# Patient Record
Sex: Male | Born: 1945 | Race: White | Hispanic: No | Marital: Married | Smoking: Never smoker
Health system: Southern US, Community
[De-identification: ages and names within clinical notes are randomized; demographics above are authoritative.]

## PROBLEM LIST (undated history)

## (undated) DIAGNOSIS — G4730 Sleep apnea, unspecified: Secondary | ICD-10-CM

## (undated) DIAGNOSIS — Z87442 Personal history of urinary calculi: Secondary | ICD-10-CM

## (undated) DIAGNOSIS — K219 Gastro-esophageal reflux disease without esophagitis: Secondary | ICD-10-CM

## (undated) DIAGNOSIS — J189 Pneumonia, unspecified organism: Secondary | ICD-10-CM

## (undated) HISTORY — PX: COLONOSCOPY: SHX174

## (undated) HISTORY — PX: EYE SURGERY: SHX253

## (undated) HISTORY — PX: BICEPS TENDON REPAIR: SHX566

## (undated) HISTORY — PX: SHOULDER SURGERY: SHX246

## (undated) HISTORY — PX: TONSILLECTOMY: SUR1361

---

## 2012-04-25 ENCOUNTER — Emergency Department (HOSPITAL_COMMUNITY): Payer: Medicare Other

## 2012-04-25 ENCOUNTER — Encounter (HOSPITAL_COMMUNITY): Payer: Self-pay | Admitting: Emergency Medicine

## 2012-04-25 ENCOUNTER — Other Ambulatory Visit: Payer: Self-pay

## 2012-04-25 ENCOUNTER — Emergency Department (HOSPITAL_COMMUNITY)
Admission: EM | Admit: 2012-04-25 | Discharge: 2012-04-25 | Disposition: A | Discharge status: Home / Self Care | Payer: Medicare Other | Attending: Emergency Medicine | Admitting: Emergency Medicine

## 2012-04-25 DIAGNOSIS — Z7982 Long term (current) use of aspirin: Secondary | ICD-10-CM | POA: Insufficient documentation

## 2012-04-25 DIAGNOSIS — R42 Dizziness and giddiness: Secondary | ICD-10-CM | POA: Insufficient documentation

## 2012-04-25 DIAGNOSIS — Z79899 Other long term (current) drug therapy: Secondary | ICD-10-CM | POA: Insufficient documentation

## 2012-04-25 DIAGNOSIS — R55 Syncope and collapse: Secondary | ICD-10-CM

## 2012-04-25 LAB — CBC WITH DIFFERENTIAL/PLATELET
Basophils Absolute: 0 10*3/uL (ref 0.0–0.1)
Basophils Relative: 1 % (ref 0–1)
Eosinophils Absolute: 0 10*3/uL (ref 0.0–0.7)
Eosinophils Relative: 0 % (ref 0–5)
Lymphs Abs: 1.2 10*3/uL (ref 0.7–4.0)
MCH: 31 pg (ref 26.0–34.0)
MCV: 85 fL (ref 78.0–100.0)
Neutrophils Relative %: 74 % (ref 43–77)
Platelets: 138 10*3/uL — ABNORMAL LOW (ref 150–400)
RBC: 5.39 MIL/uL (ref 4.22–5.81)
RDW: 12.5 % (ref 11.5–15.5)

## 2012-04-25 LAB — POCT I-STAT, CHEM 8
Calcium, Ion: 1.22 mmol/L (ref 1.13–1.30)
Creatinine, Ser: 1.1 mg/dL (ref 0.50–1.35)
Glucose, Bld: 131 mg/dL — ABNORMAL HIGH (ref 70–99)
HCT: 48 % (ref 39.0–52.0)
Hemoglobin: 16.3 g/dL (ref 13.0–17.0)
Potassium: 4.1 mEq/L (ref 3.5–5.1)
TCO2: 24 mmol/L (ref 0–100)

## 2012-04-25 LAB — COMPREHENSIVE METABOLIC PANEL
AST: 21 U/L (ref 0–37)
Alkaline Phosphatase: 54 U/L (ref 39–117)
BUN: 15 mg/dL (ref 6–23)
CO2: 25 mEq/L (ref 19–32)
Chloride: 104 mEq/L (ref 96–112)
Creatinine, Ser: 0.97 mg/dL (ref 0.50–1.35)
GFR calc non Af Amer: 85 mL/min — ABNORMAL LOW (ref >90)
Potassium: 3.9 mEq/L (ref 3.5–5.1)
Total Bilirubin: 0.7 mg/dL (ref 0.3–1.2)

## 2012-04-25 LAB — PROTIME-INR
INR: 1.04 (ref 0.00–1.49)
Prothrombin Time: 13.8 seconds (ref 11.6–15.2)

## 2012-04-25 NOTE — ED Notes (Addendum)
Pt here with dizziness starting at 0800 while driving to work; pt sts some nausea and trouble walking due to balance issues; no other neuro deficits noted; pt denies vision change or HA; pt sts took viagra this am per norm PRN; EDP SR to see pt in triage

## 2012-04-25 NOTE — ED Notes (Signed)
No swallow screen done due to normal neuro screen. CT normal. No deficits noted. EDP reporting no suspicion for CVA

## 2012-04-25 NOTE — ED Notes (Signed)
Patient transported to CT 

## 2012-04-25 NOTE — ED Provider Notes (Signed)
History     CSN: [id number]  Arrival date & time 04/25/12  1011   First MD Initiated Contact with Patient 04/25/12 1138      Chief Complaint  Patient presents with  . Dizziness  Nathan Douglas presents s/p an acute episode of dizziness.  He reports that towards the end of his ride to work he became acutely off balance.  He reports feeling tilted.  He states that he felt warm but was described by his son as being pale and sweaty.  He reports feeling nauseated also. Denies feeling spinning sensation, vomiting, unilateral weakness, chest pain, palpitations.  States he has never had a similar event.  Denies any recent illnesses but states that he has started a different diet program within the last week that limits the hours during the day in which he consumes food.  He also has started taking a vitamin supplement that contains turmeric.   He went to his PMD's office and after his blood sugar was checked he was referred to the emergency department.  (Consider location/radiation/quality/duration/timing/severity/associated sxs/prior treatment) The history is provided by the patient. No language interpreter was used.    Past Medical History  Diagnosis Date  . Kidney stone     No past surgical history on file.  No family history on file.  History  Substance Use Topics  . Smoking status: Not on file  . Smokeless tobacco: Not on file  . Alcohol Use:       Review of Systems  HENT: Negative.   Eyes: Negative.   Respiratory: Negative.   Cardiovascular: Negative.   Gastrointestinal: Positive for nausea. Negative for vomiting, abdominal pain, diarrhea, constipation, blood in stool and abdominal distention.  Genitourinary: Negative.   Musculoskeletal: Negative.   Skin: Positive for pallor. Negative for color change, rash and wound.  Neurological: Positive for dizziness, weakness and light-headedness. Negative for tremors, seizures, syncope, facial asymmetry, speech difficulty, numbness and  headaches.  Hematological: Negative for adenopathy.  Psychiatric/Behavioral: Negative for behavioral problems and agitation.    Allergies  Review of patient's allergies indicates no known allergies.  Home Medications   Current Outpatient Rx  Name Route Sig Dispense Refill  . VITAMIN C 1000 MG PO TABS Oral Take 2,000 mg by mouth daily.    . ASPIRIN EC 81 MG PO TBEC Oral Take 81 mg by mouth daily.    . CO Q-10 PO Oral Take 1 tablet by mouth daily.    Marland Kitchen GLUCOSAMINE CHONDR COMPLEX PO Oral Take 1 tablet by mouth daily.    Carma Leaven M PLUS PO TABS Oral Take 1 tablet by mouth daily.    Marland Kitchen FISH OIL 1000 MG PO CAPS Oral Take 1 capsule by mouth daily.    Marland Kitchen OVER THE COUNTER MEDICATION Oral Take 1 capsule by mouth 2 (two) times daily. Green Lipid Muscle Oil    . OVER THE COUNTER MEDICATION Oral Take 1 tablet by mouth 2 (two) times daily. [place] Grape Seed    . POTASSIUM CHLORIDE ER 10 MEQ PO TBCR Oral Take 20 mEq by mouth 2 (two) times daily.    . RED YEAST RICE PO Oral Take 1 tablet by mouth daily.    Marland Kitchen SILDENAFIL CITRATE 100 MG PO TABS Oral Take 50 mg by mouth daily as needed. For erectile dysfunction    . TESTOSTERONE CYPIONATE 200 MG/ML IM OIL Intramuscular Inject 100 mg into the muscle every 14 (fourteen) days. 1/2 ml    . ZOLPIDEM TARTRATE 10 MG PO  TABS Oral Take 10 mg by mouth at bedtime as needed. For sleep      BP 125/71  Pulse 57  Temp 97.4 F (36.3 C) (Oral)  Resp 16  SpO2 93%  Physical Exam  Constitutional: He is oriented to person, place, and time. He appears well-developed and well-nourished. No distress.  HENT:  Head: Normocephalic and atraumatic.  Right Ear: External ear normal.  Left Ear: External ear normal.  Nose: Nose normal.  Mouth/Throat: Oropharynx is clear and moist. No oropharyngeal exudate.  Eyes: Conjunctivae and EOM are normal. Pupils are equal, round, and reactive to light. Right eye exhibits no discharge. Left eye exhibits no discharge. No scleral icterus.    Neck: Trachea normal, normal range of motion and full passive range of motion without pain. Neck supple. No JVD present. Carotid bruit is not present. No rigidity. No tracheal deviation, no edema, no erythema and normal range of motion present. No mass and no thyromegaly present.  Cardiovascular: Normal rate, regular rhythm, normal heart sounds and intact distal pulses.  Exam reveals no gallop and no friction rub.   No murmur heard. Pulmonary/Chest: Effort normal and breath sounds normal. No stridor. No respiratory distress. He has no wheezes. He has no rales. He exhibits no tenderness.  Abdominal: Soft. Bowel sounds are normal. He exhibits no distension and no mass. There is no tenderness. There is no rebound and no guarding.  Musculoskeletal: Normal range of motion. He exhibits no edema and no tenderness.  Lymphadenopathy:    He has no cervical adenopathy.  Neurological: He is alert and oriented to person, place, and time. He has normal strength and normal reflexes. He displays no atrophy and no tremor. No cranial nerve deficit or sensory deficit. He exhibits normal muscle tone. He displays no seizure activity. Coordination and gait normal. GCS eye subscore is 4. GCS verbal subscore is 5. GCS motor subscore is 6.  Skin: Skin is warm and dry. No rash noted. He is not diaphoretic. No erythema. No pallor.  Psychiatric: He has a normal mood and affect. His behavior is normal. Judgment normal.    ED Course  Procedures (including critical care time)  Labs Reviewed  COMPREHENSIVE METABOLIC PANEL - Abnormal; Notable for the following:    Glucose, Bld 132 (*)     GFR calc non Af Amer 85 (*)     All other components within normal limits  CBC WITH DIFFERENTIAL - Abnormal; Notable for the following:    MCHC 36.5 (*)     Platelets 138 (*)     All other components within normal limits  GLUCOSE, CAPILLARY - Abnormal; Notable for the following:    Glucose-Capillary 128 (*)     All other components  within normal limits  POCT I-STAT, CHEM 8 - Abnormal; Notable for the following:    Glucose, Bld 131 (*)     All other components within normal limits  PROTIME-INR   Ct Head Wo Contrast  04/25/2012  *RADIOLOGY REPORT*  Clinical Data: Dizziness.  CT HEAD WITHOUT CONTRAST  Technique:  Contiguous axial images were obtained from the base of the skull through the vertex without contrast.  Comparison: Temporal bones CT scan 07/21/2010.  Findings: No acute intracranial abnormalities.  Specifically, no evidence of acute/subacute cerebral ischemia, no acute intracranial hemorrhage, no focal mass, mass effect, hydrocephalus or abnormal intra or extra-axial fluid collections.  No acute displaced skull fractures are identified.  Visualized paranasal sinuses and mastoids are well pneumatized.  IMPRESSION: 1.  No  acute intracranial abnormalities. 2.  The appearance of the brain is normal.  Original Report Authenticated By: Florencia Reasons, M.D.     No diagnosis found.  Date: 04/25/2012  Rate: 58 bpm  Rhythm: sinus bradycardia  QRS Axis: left  Intervals: normal  ST/T Wave abnormalities: normal  Conduction Disutrbances:none  Narrative Interpretation:   Old EKG Reviewed: none available     MDM  Pt Stable, NAD.  Presents s/p acute event where he became pale, sweaty, and nauseated.  Denies any pain during this event.  Pt is currently comfortable, has a steady/confident gait, has also tolerated PO intake.  He has no clinical evidence of a CVA/TIA.  Pt appears nontoxic.  While in the ER there has been no evidence of heart dysrhythmia.  He has no evidence of ischemia on his EKG and a troponin obtained greater than 3hrs s/p the onset of his s/s is negative.  Plan d/c home with instructions to f/u closely with his PMD as well as instructions to establish care with a local cardiologist for further evaluation and risk assessment.  Pt advised that if he is to have further episodes similar to this morning, he  should return immediately to the emergency department for further evaluation and likely admission.        Tobin Chad, MD 04/25/12 1430

## 2014-01-18 ENCOUNTER — Encounter (HOSPITAL_COMMUNITY): Payer: Self-pay | Admitting: Emergency Medicine

## 2014-01-18 ENCOUNTER — Emergency Department (HOSPITAL_COMMUNITY)
Admission: EM | Admit: 2014-01-18 | Discharge: 2014-01-18 | Disposition: A | Discharge status: Home / Self Care | Payer: Medicare Other | Attending: Emergency Medicine | Admitting: Emergency Medicine

## 2014-01-18 DIAGNOSIS — Z79899 Other long term (current) drug therapy: Secondary | ICD-10-CM | POA: Insufficient documentation

## 2014-01-18 DIAGNOSIS — R6883 Chills (without fever): Secondary | ICD-10-CM | POA: Insufficient documentation

## 2014-01-18 DIAGNOSIS — Z87442 Personal history of urinary calculi: Secondary | ICD-10-CM | POA: Insufficient documentation

## 2014-01-18 DIAGNOSIS — R5381 Other malaise: Secondary | ICD-10-CM | POA: Insufficient documentation

## 2014-01-18 DIAGNOSIS — R110 Nausea: Secondary | ICD-10-CM

## 2014-01-18 DIAGNOSIS — R5383 Other fatigue: Secondary | ICD-10-CM

## 2014-01-18 DIAGNOSIS — Z7982 Long term (current) use of aspirin: Secondary | ICD-10-CM | POA: Insufficient documentation

## 2014-01-18 DIAGNOSIS — R109 Unspecified abdominal pain: Secondary | ICD-10-CM

## 2014-01-18 DIAGNOSIS — R638 Other symptoms and signs concerning food and fluid intake: Secondary | ICD-10-CM | POA: Insufficient documentation

## 2014-01-18 LAB — CBC WITH DIFFERENTIAL/PLATELET
BASOS PCT: 0 % (ref 0–1)
Basophils Absolute: 0 10*3/uL (ref 0.0–0.1)
Eosinophils Absolute: 0 10*3/uL (ref 0.0–0.7)
Eosinophils Relative: 0 % (ref 0–5)
HCT: 46.4 % (ref 39.0–52.0)
HEMOGLOBIN: 16.2 g/dL (ref 13.0–17.0)
LYMPHS ABS: 0.8 10*3/uL (ref 0.7–4.0)
Lymphocytes Relative: 19 % (ref 12–46)
MCH: 31.4 pg (ref 26.0–34.0)
MCHC: 34.9 g/dL (ref 30.0–36.0)
MCV: 89.9 fL (ref 78.0–100.0)
MONOS PCT: 14 % — AB (ref 3–12)
Monocytes Absolute: 0.6 10*3/uL (ref 0.1–1.0)
NEUTROS ABS: 3 10*3/uL (ref 1.7–7.7)
NEUTROS PCT: 67 % (ref 43–77)
Platelets: 121 10*3/uL — ABNORMAL LOW (ref 150–400)
RBC: 5.16 MIL/uL (ref 4.22–5.81)
RDW: 13.3 % (ref 11.5–15.5)
WBC: 4.5 10*3/uL (ref 4.0–10.5)

## 2014-01-18 LAB — COMPREHENSIVE METABOLIC PANEL
ALBUMIN: 3.7 g/dL (ref 3.5–5.2)
ALK PHOS: 48 U/L (ref 39–117)
ALT: 34 U/L (ref 0–53)
AST: 31 U/L (ref 0–37)
BILIRUBIN TOTAL: 0.7 mg/dL (ref 0.3–1.2)
BUN: 14 mg/dL (ref 6–23)
CHLORIDE: 103 meq/L (ref 96–112)
CO2: 23 mEq/L (ref 19–32)
Calcium: 8.6 mg/dL (ref 8.4–10.5)
Creatinine, Ser: 1.09 mg/dL (ref 0.50–1.35)
GFR calc Af Amer: 79 mL/min — ABNORMAL LOW (ref >90)
GFR calc non Af Amer: 68 mL/min — ABNORMAL LOW (ref >90)
Glucose, Bld: 101 mg/dL — ABNORMAL HIGH (ref 70–99)
POTASSIUM: 4.1 meq/L (ref 3.7–5.3)
Sodium: 139 mEq/L (ref 137–147)
Total Protein: 6.5 g/dL (ref 6.0–8.3)

## 2014-01-18 LAB — URINALYSIS, ROUTINE W REFLEX MICROSCOPIC
BILIRUBIN URINE: NEGATIVE
GLUCOSE, UA: NEGATIVE mg/dL
KETONES UR: NEGATIVE mg/dL
LEUKOCYTES UA: NEGATIVE
Nitrite: NEGATIVE
PROTEIN: NEGATIVE mg/dL
Specific Gravity, Urine: 1.03 (ref 1.005–1.030)
Urobilinogen, UA: 0.2 mg/dL (ref 0.0–1.0)
pH: 6 (ref 5.0–8.0)

## 2014-01-18 LAB — URINE MICROSCOPIC-ADD ON

## 2014-01-18 MED ORDER — ONDANSETRON 8 MG PO TBDP: 8.0000 mg | ORAL_TABLET | Freq: Three times a day (TID) | ORAL | Status: DC | PRN

## 2014-01-18 MED ORDER — ONDANSETRON HCL 4 MG/2ML IJ SOLN
4.0000 mg | Freq: Once | INTRAMUSCULAR | Status: AC
  Administered 2014-01-18: 4 mg via INTRAVENOUS
  Filled 2014-01-18: qty 2

## 2014-01-18 MED ORDER — SODIUM CHLORIDE 0.9 % IV BOLUS (SEPSIS)
500.0000 mL | Freq: Once | INTRAVENOUS | Status: AC
  Administered 2014-01-18: 500 mL via INTRAVENOUS

## 2014-01-18 NOTE — Discharge Instructions (Signed)
Flank Pain Flank pain refers to pain that is located on the side of the body between the upper abdomen and the back. The pain may occur over a short period of time (acute) or may be long-term or reoccurring (chronic). It may be mild or severe. Flank pain can be caused by many things. CAUSES  Some of the more common causes of flank pain include:  Muscle strains.   Muscle spasms.   A disease of your spine (vertebral disk disease).   A lung infection (pneumonia).   Fluid around your lungs (pulmonary edema).   A kidney infection.   Kidney stones.   A very painful skin rash caused by the chickenpox virus (shingles).   Gallbladder disease.  HOME CARE INSTRUCTIONS  Home care will depend on the cause of your pain. In general,  Rest as directed by your caregiver.  Drink enough fluids to keep your urine clear or pale yellow.  Only take over-the-counter or prescription medicines as directed by your caregiver. Some medicines may help relieve the pain.  Tell your caregiver about any changes in your pain.  Follow up with your caregiver as directed. SEEK IMMEDIATE MEDICAL CARE IF:   Your pain is not controlled with medicine.   You have new or worsening symptoms.  Your pain increases.   You have abdominal pain.   You have shortness of breath.   You have persistent nausea or vomiting.   You have swelling in your abdomen.   You feel faint or pass out.   You have blood in your urine.  You have a fever or persistent symptoms for more than 2 3 days.  You have a fever and your symptoms suddenly get worse. MAKE SURE YOU:   Understand these instructions.  Will watch your condition.  Will get help right away if you are not doing well or get worse. Document Released: 11/09/2005 Document Revised: 06/12/2012 Document Reviewed: 05/02/2012 [facility]uclidExitCare Patient Information 2014 [place]ExitCare, [place].  Nausea, Adult Nausea is the feeling that you have an upset stomach or  have to vomit. Nausea by itself is not likely a serious concern, but it may be an early sign of more serious medical problems. As nausea gets worse, it can lead to vomiting. If vomiting develops, there is the risk of dehydration.  CAUSES   Viral infections.  Food poisoning.  Medicines.  Pregnancy.  Motion sickness.  Migraine headaches.  Emotional distress.  Severe pain from any source.  Alcohol intoxication. HOME CARE INSTRUCTIONS  Get plenty of rest.  Ask your caregiver about specific rehydration instructions.  Eat small amounts of food and sip liquids more often.  Take all medicines as told by your caregiver. SEEK MEDICAL CARE IF:  You have not improved after 2 days, or you get worse.  You have a headache. SEEK IMMEDIATE MEDICAL CARE IF:   You have a fever.  You faint.  You keep vomiting or have blood in your vomit.  You are extremely weak or dehydrated.  You have dark or bloody stools.  You have severe chest or abdominal pain. MAKE SURE YOU:  Understand these instructions.  Will watch your condition.  Will get help right away if you are not doing well or get worse. Document Released: 10/26/2004 Document Revised: 06/12/2012 Document Reviewed: 05/31/2011 [facility] Patient Information 2014 [place], [place].

## 2014-01-18 NOTE — ED Notes (Signed)
Pt presents to department for evaluation of bilateral flank pain. Ongoing since Friday. Also states nausea and generalized weakness. History of kidney stones. 5/10 pain at the time. Pt is alert and oriented x4.

## 2014-01-18 NOTE — ED Notes (Signed)
Sent urine culture request sheet down to lab to add on to urine already in lab.

## 2014-01-18 NOTE — ED Provider Notes (Signed)
CSN: [id number]     Arrival date & time 01/18/14  [id number] History   First MD Initiated Contact with Patient 01/18/14 1106     Chief Complaint  Patient presents with  . Flank Pain     (Consider location/radiation/quality/duration/timing/severity/associated sxs/prior Treatment) Patient is a 68 y.o. male presenting with flank pain. The history is provided by the patient.  Flank Pain Pertinent negatives include no chest pain, no abdominal pain, no headaches and no shortness of breath.   patient so bad the last couple days. He's had myalgias. He's had left flank pain. He states it feels like when he has had a kidney infection previously. He has had nausea without vomiting. No chest pain. No diarrhea constipation. No abdominal pain. No cough. No headache. No sore throat. The pain is dull.  Past Medical History  Diagnosis Date  . Kidney stone    History reviewed. No pertinent past surgical history. No family history on file. History  Substance Use Topics  . Smoking status: Never Smoker   . Smokeless tobacco: Not on file  . Alcohol Use: Yes     Comment: social    Review of Systems  Constitutional: Positive for chills, appetite change and fatigue. Negative for activity change.  Eyes: Negative for pain.  Respiratory: Negative for chest tightness and shortness of breath.   Cardiovascular: Negative for chest pain and leg swelling.  Gastrointestinal: Positive for nausea. Negative for vomiting, abdominal pain and diarrhea.  Genitourinary: Positive for flank pain. Negative for discharge and testicular pain.  Musculoskeletal: Negative for back pain and neck stiffness.  Skin: Negative for rash.  Neurological: Negative for weakness, numbness and headaches.  Psychiatric/Behavioral: Negative for behavioral problems.      Allergies  Review of patient's allergies indicates no known allergies.  Home Medications   Prior to Admission medications   Medication Sig Start Date End Date Taking?  Authorizing Provider  Ascorbic Acid (VITAMIN C) 1000 MG tablet Take 2,000 mg by mouth daily.    Historical Provider, MD  aspirin EC 81 MG tablet Take 81 mg by mouth daily.    Historical Provider, MD  Coenzyme Q10 (CO Q-10 PO) Take 1 tablet by mouth daily.    Historical Provider, MD  Glucosamine-Chondroitin (GLUCOSAMINE CHONDR COMPLEX PO) Take 1 tablet by mouth daily.    Historical Provider, MD  Multiple Vitamins-Minerals (MULTIVITAMINS THER. W/MINERALS) TABS Take 1 tablet by mouth daily.    Historical Provider, MD  Omega-3 Fatty Acids (FISH OIL) 1000 MG CAPS Take 1 capsule by mouth daily.    Historical Provider, MD  OVER THE COUNTER MEDICATION Take 1 capsule by mouth 2 (two) times daily. Green Lipid Muscle Oil    Historical Provider, MD  OVER THE COUNTER MEDICATION Take 1 tablet by mouth 2 (two) times daily. [place] Grape Seed    Historical Provider, MD  potassium chloride (K-DUR) 10 MEQ tablet Take 20 mEq by mouth 2 (two) times daily.    Historical Provider, MD  Red Yeast Rice Extract (RED YEAST RICE PO) Take 1 tablet by mouth daily.    Historical Provider, MD  sildenafil (VIAGRA) 100 MG tablet Take 50 mg by mouth daily as needed. For erectile dysfunction    Historical Provider, MD  testosterone cypionate (DEPOTESTOTERONE CYPIONATE) 200 MG/ML injection Inject 100 mg into the muscle every 14 (fourteen) days. 1/2 ml    Historical Provider, MD  zolpidem (AMBIEN) 10 MG tablet Take 10 mg by mouth at bedtime as needed. For sleep    Historical  Provider, MD   BP 120/71  Pulse 70  Temp(Src) 98.4 F (36.9 C) (Oral)  Resp 18  SpO2 95% Physical Exam  Nursing note and vitals reviewed. Constitutional: He is oriented to person, place, and time. He appears well-developed and well-nourished.  HENT:  Head: Normocephalic and atraumatic.  Mouth/Throat: No oropharyngeal exudate.  Neck: Normal range of motion. Neck supple.  Cardiovascular: Normal rate, regular rhythm and normal heart sounds.   No murmur  heard. Pulmonary/Chest: Effort normal and breath sounds normal.  Abdominal: Soft. Bowel sounds are normal. He exhibits no distension and no mass. There is no tenderness. There is no rebound and no guarding.  Genitourinary:  Mild tenderness and left CVA area.  Musculoskeletal: Normal range of motion. He exhibits no edema.  Neurological: He is alert and oriented to person, place, and time. No cranial nerve deficit.  Skin: Skin is warm and dry.  Psychiatric: He has a normal mood and affect.    ED Course  Procedures (including critical care time) Labs Review Labs Reviewed  CBC WITH DIFFERENTIAL - Abnormal; Notable for the following:    Platelets 121 (*)    Monocytes Relative 14 (*)    All other components within normal limits  COMPREHENSIVE METABOLIC PANEL - Abnormal; Notable for the following:    Glucose, Bld 101 (*)    GFR calc non Af Amer 68 (*)    GFR calc Af Amer 79 (*)    All other components within normal limits  URINALYSIS, ROUTINE W REFLEX MICROSCOPIC - Abnormal; Notable for the following:    Color, Urine AMBER (*)    Hgb urine dipstick TRACE (*)    All other components within normal limits  URINE CULTURE  URINE MICROSCOPIC-ADD ON    Imaging Review No results found.   EKG Interpretation None      MDM   Final diagnoses:  Nausea  Flank pain    Patient with nausea and left flank pain. No abdominal tenderness. Weber is reassuring. Patient felt as if he had a UTI, however urinalysis overall reassuring. Urine cultures were sent. Patient is tolerated orals will be discharged home. He feels better after the IV Zofran. May declare itself to be more viral syndrome/gastroenteritis, but may require followup.    Juliet RudeNathan R. Rubin PayorPickering, MD 01/18/14 1413

## 2014-01-18 NOTE — ED Notes (Signed)
Onset Friday pt started not feeling well, generalized aches, nausea and bilateral flank pain.  Pt was able to do normal activities on Friday and Saturday morning.  No other s/s noted.

## 2014-01-19 LAB — URINE CULTURE
CULTURE: NO GROWTH
Colony Count: NO GROWTH

## 2016-10-28 ENCOUNTER — Encounter (HOSPITAL_COMMUNITY): Payer: Self-pay | Admitting: *Deleted

## 2016-10-28 ENCOUNTER — Emergency Department (HOSPITAL_COMMUNITY)
Admission: EM | Admit: 2016-10-28 | Discharge: 2016-10-29 | Disposition: A | Discharge status: Home / Self Care | Payer: Medicare HMO | Attending: Emergency Medicine | Admitting: Emergency Medicine

## 2016-10-28 DIAGNOSIS — W109XXA Fall (on) (from) unspecified stairs and steps, initial encounter: Secondary | ICD-10-CM | POA: Insufficient documentation

## 2016-10-28 DIAGNOSIS — Y999 Unspecified external cause status: Secondary | ICD-10-CM | POA: Insufficient documentation

## 2016-10-28 DIAGNOSIS — Y9389 Activity, other specified: Secondary | ICD-10-CM | POA: Insufficient documentation

## 2016-10-28 DIAGNOSIS — Y929 Unspecified place or not applicable: Secondary | ICD-10-CM | POA: Diagnosis not present

## 2016-10-28 DIAGNOSIS — S76112A Strain of left quadriceps muscle, fascia and tendon, initial encounter: Secondary | ICD-10-CM | POA: Insufficient documentation

## 2016-10-28 DIAGNOSIS — S8992XA Unspecified injury of left lower leg, initial encounter: Secondary | ICD-10-CM | POA: Diagnosis present

## 2016-10-28 DIAGNOSIS — Z7982 Long term (current) use of aspirin: Secondary | ICD-10-CM | POA: Diagnosis not present

## 2016-10-28 NOTE — ED Provider Notes (Signed)
[facility]-EMERGENCY DEPT Provider Note   CSN: [id number] Arrival date & time: 10/28/16  2315  By signing my name below, I, Doreatha Martin, attest that this documentation has been prepared under the direction and in the presence of Zadie Rhine, MD. Electronically Signed: Doreatha Martin, ED Scribe. 10/28/16. 11:55 PM.     History   Chief Complaint Chief Complaint  Patient presents with  . Leg Injury    HPI Nathan Douglas is a 71 y.o. male who presents to the Emergency Department complaining of moderate left knee pain s/p mechanical fall that occurred earlier tonight. Pt states he slipped while going down the stairs in flip flops, fell forward and landed directly on his left knee. He denies LOC, head injury. He states he is currently unable to bear weight on the knee secondary to pain. Pt denies taking OTC medications at home to improve symptoms. He also denies headache, neck pain, back pain, arm pain, CP, SOB, additional injuries or complaints.   The history is provided by the patient. No language interpreter was used.  Fall  This is a new problem. The current episode started 1 to 2 hours ago. The problem occurs constantly. The problem has been gradually worsening. Pertinent negatives include no chest pain, no abdominal pain, no headaches and no shortness of breath. The symptoms are aggravated by walking. Nothing relieves the symptoms. He has tried nothing for the symptoms. The treatment provided no relief.    Past Medical History:  Diagnosis Date  . Kidney stone     There are no active problems to display for this patient.   History reviewed. No pertinent surgical history.     Home Medications    Prior to Admission medications   Medication Sig Start Date End Date Taking? Authorizing Provider  Ascorbic Acid (VITAMIN C) 1000 MG tablet Take 2,000 mg by mouth daily.    Historical Provider, MD  aspirin EC 81 MG tablet Take 81 mg by mouth daily.    Historical Provider, MD  Coenzyme Q10  (CO Q-10 PO) Take 1 tablet by mouth daily.    Historical Provider, MD  Glucosamine-Chondroitin (GLUCOSAMINE CHONDR COMPLEX PO) Take 1 tablet by mouth daily.    Historical Provider, MD  KRILL OIL PO Take 1 tablet by mouth daily.    Historical Provider, MD  Multiple Vitamins-Minerals (MULTIVITAMINS THER. W/MINERALS) TABS Take 1 tablet by mouth daily.    Historical Provider, MD  Omega-3 Fatty Acids (FISH OIL) 1000 MG CAPS Take 1 capsule by mouth daily.    Historical Provider, MD  ondansetron (ZOFRAN-ODT) 8 MG disintegrating tablet Take 1 tablet (8 mg total) by mouth every 8 (eight) hours as needed for nausea or vomiting. 01/18/14   Benjiman Core, MD  potassium chloride (K-DUR) 10 MEQ tablet Take 10 mEq by mouth 2 (two) times daily.     Historical Provider, MD  Red Yeast Rice Extract (RED YEAST RICE PO) Take 1 tablet by mouth daily.    Historical Provider, MD  Saw Palmetto, Serenoa repens, (SAW PALMETTO PO) Take 1 tablet by mouth daily.    Historical Provider, MD  sildenafil (VIAGRA) 100 MG tablet Take 50 mg by mouth daily as needed. For erectile dysfunction    Historical Provider, MD  testosterone cypionate (DEPOTESTOTERONE CYPIONATE) 200 MG/ML injection Inject 100 mg into the muscle every 14 (fourteen) days. 1/2 ml    Historical Provider, MD  zolpidem (AMBIEN) 10 MG tablet Take 10 mg by mouth at bedtime as needed. For sleep  Historical Provider, MD    Family History No family history on file.  Social History Social History  Substance Use Topics  . Smoking status: Never Smoker  . Smokeless tobacco: Never Used  . Alcohol use Yes     Comment: social     Allergies   Patient has no known allergies.   Review of Systems Review of Systems  Respiratory: Negative for shortness of breath.   Cardiovascular: Negative for chest pain.  Gastrointestinal: Negative for abdominal pain.  Musculoskeletal: Positive for arthralgias (left knee). Negative for back pain and neck pain.  Neurological:  Negative for syncope and headaches.  All other systems reviewed and are negative.    Physical Exam Updated Vital Signs BP 142/78 (BP Location: Left Arm)   Pulse 76   Temp 97.9 F (36.6 C) (Oral)   Resp 18   Ht 5\' 8"  (1.727 m)   Wt 172 lb (78 kg)   SpO2 96%   BMI 26.15 kg/m   Physical Exam CONSTITUTIONAL: Well developed/well nourished HEAD: Normocephalic/atraumatic EYES: EOMI ENMT: Mucous membranes moist NECK: supple no meningeal signs SPINE/BACK:entire spine nontender. No c-spine tenderness.  CV: S1/S2 noted, no murmurs/rubs/gallops noted LUNGS: Lungs are clear to auscultation bilaterally, no apparent distress ABDOMEN: soft, nontender, no rebound or guarding, bowel sounds noted throughout abdomen GU:no cva tenderness NEURO: Pt is awake/alert/appropriate, moves all extremitiesx4.  No facial droop.   EXTREMITIES: pulses normal/equal. Mild tenderness to left knee. Step-off proximal to patella likely d/t quadriceps tendon tear. DPs intact, All other extremities/joints palpated/ranged and nontender.  Left achilles intact SKIN: warm, color normal PSYCH: no abnormalities of mood noted, alert and oriented to situation   ED Treatments / Results   DIAGNOSTIC STUDIES: Oxygen Saturation is 96% on RA, adequate by my interpretation.    COORDINATION OF CARE: 11:50 PM Discussed treatment plan with pt at bedside which includes XR, knee immobilizer and crutches, ortho f/u and pt agreed to plan.    Labs (all labs ordered are listed, but only abnormal results are displayed) Labs Reviewed - No data to display  EKG  EKG Interpretation None       Radiology Dg Knee Complete 4 Views Left  Result Date: 10/29/2016 CLINICAL DATA:  Left knee pain with difficulty bearing weight after falling down stairs. Quadriceps tendon rupture. EXAM: LEFT KNEE - COMPLETE 4+ VIEW COMPARISON:  None. FINDINGS: There is 10 x 2 mm avulsed bony fragment seen on the lateral view along the anterior aspect of  the distal left thigh likely representing a quadriceps tendon avulsion. This fragment is 3 cm above the upper pole of the patella. Soft tissue thickening adjacent to the avulsed fragment likely represents a retracted tendon. No significant joint effusion. The femorotibial and patellofemoral compartments maintained. IMPRESSION: Findings would be in keeping with an avulsion of the quadriceps tendon insertion with small 10 x 2mm bony fragment seen approximately 3 cm from the upper pole of the patella. Electronically Signed   By: Tollie Eth M.D.   On: 10/29/2016 01:26    Procedures Procedures (including critical care time) SPLINT APPLICATION Date/Time: [id number] Authorized by: Joya Gaskins Consent: Verbal consent obtained. Risks and benefits: risks, benefits and alternatives were discussed Consent given by: patient Splint applied by: orthopedic technician Location details: left leg Splint type: knee immobilizer Supplies used: knee immobilizer Post-procedure: The splinted body part was neurovascularly unchanged following the procedure. Patient tolerance: Patient tolerated the procedure well with no immediate complications.    Medications Ordered in ED Medications - No  data to display   Initial Impression / Assessment and Plan / ED Course  I have reviewed the triage vital signs and the nursing notes.  Pertinent  imaging results that were available during my care of the patient were reviewed by me and considered in my medical decision making (see chart for details).     Pt with quad tendon tear by exam No other acute extremity injury Pt ambulatory Tolerated knee immobilizer and crutches well He will call ortho in 24 hours He declines pain meds    Final Clinical Impressions(s) / ED Diagnoses   Final diagnoses:  Quadriceps tendon rupture, left, initial encounter    New Prescriptions New Prescriptions   No medications on file    I personally performed the services  described in this documentation, which was scribed in my presence. The recorded information has been reviewed and is accurate.        Zadie Rhineonald Braheem Tomasik, MD 10/29/16 [phone]

## 2016-10-28 NOTE — ED Triage Notes (Signed)
Patient states he feel down some stairs at home while wearing flip flops and reading glassses  Unable to bear weight on his left leg.

## 2016-10-29 ENCOUNTER — Emergency Department (HOSPITAL_COMMUNITY): Payer: Medicare HMO

## 2016-10-29 NOTE — Progress Notes (Signed)
Orthopedic Tech Progress Note Patient Details:  Nathan LewandowskyJoseph Douglas May 06, 1946 [id number]  Ortho Devices Type of Ortho Device: Crutches, Knee Immobilizer Ortho Device/Splint Location: LLE Ortho Device/Splint Interventions: Ordered, Application, Adjustment   Nathan MoccasinHughes, Nathan Douglas 10/29/2016, 1:19 AM

## 2016-11-02 ENCOUNTER — Encounter (HOSPITAL_COMMUNITY): Payer: Self-pay | Admitting: *Deleted

## 2016-11-02 NOTE — Progress Notes (Signed)
Spoke with pt for pre-op call. Pt denies any cardiac history, chest pain or sob. 

## 2016-11-03 ENCOUNTER — Ambulatory Visit (HOSPITAL_COMMUNITY): Payer: Medicare HMO | Admitting: Anesthesiology

## 2016-11-03 ENCOUNTER — Encounter (HOSPITAL_COMMUNITY)
Admission: RE | Disposition: A | Discharge status: Home / Self Care | Payer: Self-pay | Source: Ambulatory Visit | Attending: Orthopedic Surgery

## 2016-11-03 ENCOUNTER — Encounter (HOSPITAL_COMMUNITY): Payer: Self-pay | Admitting: Anesthesiology

## 2016-11-03 ENCOUNTER — Ambulatory Visit (HOSPITAL_COMMUNITY)
Admission: RE | Admit: 2016-11-03 | Discharge: 2016-11-03 | Disposition: A | Discharge status: Home / Self Care | Payer: Medicare HMO | Source: Ambulatory Visit | Attending: Orthopedic Surgery | Admitting: Orthopedic Surgery

## 2016-11-03 DIAGNOSIS — Z79899 Other long term (current) drug therapy: Secondary | ICD-10-CM | POA: Insufficient documentation

## 2016-11-03 DIAGNOSIS — S76112A Strain of left quadriceps muscle, fascia and tendon, initial encounter: Secondary | ICD-10-CM | POA: Diagnosis present

## 2016-11-03 DIAGNOSIS — Z7951 Long term (current) use of inhaled steroids: Secondary | ICD-10-CM | POA: Diagnosis not present

## 2016-11-03 DIAGNOSIS — W19XXXA Unspecified fall, initial encounter: Secondary | ICD-10-CM | POA: Insufficient documentation

## 2016-11-03 HISTORY — DX: Gastro-esophageal reflux disease without esophagitis: K21.9

## 2016-11-03 HISTORY — PX: QUADRICEPS TENDON REPAIR: SHX756

## 2016-11-03 HISTORY — DX: Pneumonia, unspecified organism: J18.9

## 2016-11-03 HISTORY — DX: Personal history of urinary calculi: Z87.442

## 2016-11-03 HISTORY — DX: Sleep apnea, unspecified: G47.30

## 2016-11-03 LAB — BASIC METABOLIC PANEL
Anion gap: 7 (ref 5–15)
BUN: 21 mg/dL — ABNORMAL HIGH (ref 6–20)
CALCIUM: 9.2 mg/dL (ref 8.9–10.3)
CO2: 23 mmol/L (ref 22–32)
CREATININE: 1.05 mg/dL (ref 0.61–1.24)
Chloride: 108 mmol/L (ref 101–111)
GFR calc non Af Amer: >60 mL/min (ref >60)
GLUCOSE: 95 mg/dL (ref 65–99)
Potassium: 5.8 mmol/L — ABNORMAL HIGH (ref 3.5–5.1)
Sodium: 138 mmol/L (ref 135–145)

## 2016-11-03 LAB — CBC
HCT: 48.1 % (ref 39.0–52.0)
Hemoglobin: 16.9 g/dL (ref 13.0–17.0)
MCH: 30.7 pg (ref 26.0–34.0)
MCHC: 35.1 g/dL (ref 30.0–36.0)
MCV: 87.3 fL (ref 78.0–100.0)
PLATELETS: 146 10*3/uL — AB (ref 150–400)
RBC: 5.51 MIL/uL (ref 4.22–5.81)
RDW: 13 % (ref 11.5–15.5)
WBC: 4.6 10*3/uL (ref 4.0–10.5)

## 2016-11-03 SURGERY — REPAIR, TENDON, QUADRICEPS
Anesthesia: Regional | Site: Knee | Laterality: Left

## 2016-11-03 MED ORDER — MIDAZOLAM HCL 2 MG/2ML IJ SOLN
INTRAMUSCULAR | Status: AC
  Administered 2016-11-03: 1 mg
  Filled 2016-11-03: qty 2

## 2016-11-03 MED ORDER — MIDAZOLAM HCL 2 MG/2ML IJ SOLN
INTRAMUSCULAR | Status: AC
  Filled 2016-11-03: qty 2

## 2016-11-03 MED ORDER — EPINEPHRINE PF 1 MG/ML IJ SOLN
INTRAMUSCULAR | Status: AC
  Filled 2016-11-03: qty 1

## 2016-11-03 MED ORDER — BUPIVACAINE HCL (PF) 0.5 % IJ SOLN
INTRAMUSCULAR | Status: AC
  Filled 2016-11-03: qty 30

## 2016-11-03 MED ORDER — MIDAZOLAM HCL 5 MG/5ML IJ SOLN
INTRAMUSCULAR | Status: DC | PRN
  Administered 2016-11-03: 2 mg via INTRAVENOUS

## 2016-11-03 MED ORDER — CHLORHEXIDINE GLUCONATE CLOTH 2 % EX PADS: 6.0000 | MEDICATED_PAD | Freq: Once | CUTANEOUS | Status: DC

## 2016-11-03 MED ORDER — 0.9 % SODIUM CHLORIDE (POUR BTL) OPTIME
TOPICAL | Status: DC | PRN
  Administered 2016-11-03: 1000 mL

## 2016-11-03 MED ORDER — PHENYLEPHRINE 40 MCG/ML (10ML) SYRINGE FOR IV PUSH (FOR BLOOD PRESSURE SUPPORT)
PREFILLED_SYRINGE | INTRAVENOUS | Status: AC
  Filled 2016-11-03: qty 10

## 2016-11-03 MED ORDER — KETOROLAC TROMETHAMINE 30 MG/ML IJ SOLN
INTRAMUSCULAR | Status: DC | PRN
  Administered 2016-11-03: 30 mg via INTRAVENOUS

## 2016-11-03 MED ORDER — CEFAZOLIN SODIUM 1 G IJ SOLR
INTRAMUSCULAR | Status: AC
  Filled 2016-11-03: qty 20

## 2016-11-03 MED ORDER — LIDOCAINE 2% (20 MG/ML) 5 ML SYRINGE
INTRAMUSCULAR | Status: AC
  Filled 2016-11-03: qty 5

## 2016-11-03 MED ORDER — ONDANSETRON HCL 4 MG/2ML IJ SOLN
INTRAMUSCULAR | Status: DC | PRN
  Administered 2016-11-03: 4 mg via INTRAVENOUS

## 2016-11-03 MED ORDER — EPHEDRINE SULFATE 50 MG/ML IJ SOLN
INTRAMUSCULAR | Status: DC | PRN
  Administered 2016-11-03: 10 mg via INTRAVENOUS

## 2016-11-03 MED ORDER — ROPIVACAINE HCL 7.5 MG/ML IJ SOLN
INTRAMUSCULAR | Status: DC | PRN
  Administered 2016-11-03: 30 mL via PERINEURAL

## 2016-11-03 MED ORDER — FENTANYL CITRATE (PF) 100 MCG/2ML IJ SOLN
INTRAMUSCULAR | Status: AC
  Administered 2016-11-03: 50 ug
  Filled 2016-11-03: qty 2

## 2016-11-03 MED ORDER — PHENYLEPHRINE 40 MCG/ML (10ML) SYRINGE FOR IV PUSH (FOR BLOOD PRESSURE SUPPORT)
PREFILLED_SYRINGE | INTRAVENOUS | Status: DC | PRN
  Administered 2016-11-03: 80 ug via INTRAVENOUS
  Administered 2016-11-03: 40 ug via INTRAVENOUS
  Administered 2016-11-03: 80 ug via INTRAVENOUS
  Administered 2016-11-03: 40 ug via INTRAVENOUS
  Administered 2016-11-03 (×2): 80 ug via INTRAVENOUS

## 2016-11-03 MED ORDER — LACTATED RINGERS IV SOLN
INTRAVENOUS | Status: DC
  Administered 2016-11-03: 50 mL/h via INTRAVENOUS
  Administered 2016-11-03: 16:00:00 via INTRAVENOUS

## 2016-11-03 MED ORDER — FENTANYL CITRATE (PF) 100 MCG/2ML IJ SOLN
INTRAMUSCULAR | Status: DC | PRN
  Administered 2016-11-03 (×2): 25 ug via INTRAVENOUS

## 2016-11-03 MED ORDER — PROPOFOL 10 MG/ML IV BOLUS
INTRAVENOUS | Status: DC | PRN
  Administered 2016-11-03: 200 mg via INTRAVENOUS

## 2016-11-03 MED ORDER — LIDOCAINE 2% (20 MG/ML) 5 ML SYRINGE
INTRAMUSCULAR | Status: DC | PRN
  Administered 2016-11-03: 60 mg via INTRAVENOUS

## 2016-11-03 MED ORDER — CEFAZOLIN SODIUM-DEXTROSE 2-4 GM/100ML-% IV SOLN
2.0000 g | INTRAVENOUS | Status: AC
  Administered 2016-11-03: 2 g via INTRAVENOUS

## 2016-11-03 MED ORDER — PROPOFOL 10 MG/ML IV BOLUS
INTRAVENOUS | Status: AC
  Filled 2016-11-03: qty 20

## 2016-11-03 MED ORDER — OXYCODONE-ACETAMINOPHEN 5-325 MG PO TABS: 1.0000 | ORAL_TABLET | ORAL | 0 refills | Status: AC | PRN

## 2016-11-03 MED ORDER — CHLORHEXIDINE GLUCONATE 4 % EX LIQD: 60.0000 mL | Freq: Once | CUTANEOUS | Status: DC

## 2016-11-03 MED ORDER — METHOCARBAMOL 500 MG PO TABS: 500.0000 mg | ORAL_TABLET | Freq: Three times a day (TID) | ORAL | 1 refills | Status: AC | PRN

## 2016-11-03 MED ORDER — FENTANYL CITRATE (PF) 100 MCG/2ML IJ SOLN
INTRAMUSCULAR | Status: AC
  Filled 2016-11-03: qty 2

## 2016-11-03 MED ORDER — EPHEDRINE 5 MG/ML INJ
INTRAVENOUS | Status: AC
  Filled 2016-11-03: qty 20

## 2016-11-03 SURGICAL SUPPLY — 71 items
BANDAGE ACE 4X5 VEL STRL LF (GAUZE/BANDAGES/DRESSINGS) IMPLANT
BANDAGE ACE 6X5 VEL STRL LF (GAUZE/BANDAGES/DRESSINGS) IMPLANT
BANDAGE ESMARK 6X9 LF (GAUZE/BANDAGES/DRESSINGS) ×1 IMPLANT
BLADE SURG 10 STRL SS (BLADE) ×3 IMPLANT
BLADE SURG 15 STRL LF DISP TIS (BLADE) ×1 IMPLANT
BLADE SURG 15 STRL SS (BLADE) ×2
BLADE SURG ROTATE 9660 (MISCELLANEOUS) IMPLANT
BNDG COHESIVE 6X5 TAN STRL LF (GAUZE/BANDAGES/DRESSINGS) ×3 IMPLANT
BNDG ELASTIC 6X10 VLCR STRL LF (GAUZE/BANDAGES/DRESSINGS) ×3 IMPLANT
BNDG ESMARK 6X9 LF (GAUZE/BANDAGES/DRESSINGS) ×3
BNDG GAUZE ELAST 4 BULKY (GAUZE/BANDAGES/DRESSINGS) ×6 IMPLANT
CLEANER TIP ELECTROSURG 2X2 (MISCELLANEOUS) ×3 IMPLANT
COVER MAYO STAND STRL (DRAPES) ×3 IMPLANT
COVER SURGICAL LIGHT HANDLE (MISCELLANEOUS) ×3 IMPLANT
CUFF TOURNIQUET SINGLE 34IN LL (TOURNIQUET CUFF) ×3 IMPLANT
CUFF TOURNIQUET SINGLE 44IN (TOURNIQUET CUFF) IMPLANT
DRAPE INCISE IOBAN 66X45 STRL (DRAPES) IMPLANT
DRAPE U-SHAPE 47X51 STRL (DRAPES) ×3 IMPLANT
DRSG ADAPTIC 3X8 NADH LF (GAUZE/BANDAGES/DRESSINGS) ×3 IMPLANT
DRSG PAD ABDOMINAL 8X10 ST (GAUZE/BANDAGES/DRESSINGS) ×6 IMPLANT
DURAPREP 26ML APPLICATOR (WOUND CARE) ×6 IMPLANT
ELECT NEEDLE TIP 2.8 STRL (NEEDLE) IMPLANT
ELECT REM PT RETURN 9FT ADLT (ELECTROSURGICAL) ×3
ELECTRODE REM PT RTRN 9FT ADLT (ELECTROSURGICAL) ×1 IMPLANT
GAUZE SPONGE 4X4 12PLY STRL (GAUZE/BANDAGES/DRESSINGS) ×3 IMPLANT
GLOVE BIO SURGEON STRL SZ7.5 (GLOVE) ×3 IMPLANT
GLOVE BIOGEL PI ORTHO PRO SZ8 (GLOVE) ×2
GLOVE ORTHO TXT STRL SZ7.5 (GLOVE) ×3 IMPLANT
GLOVE PI ORTHO PRO STRL SZ8 (GLOVE) ×1 IMPLANT
GLOVE SURG ORTHO 8.5 STRL (GLOVE) ×3 IMPLANT
GOWN STRL REUS W/ TWL LRG LVL3 (GOWN DISPOSABLE) ×1 IMPLANT
GOWN STRL REUS W/ TWL XL LVL3 (GOWN DISPOSABLE) ×2 IMPLANT
GOWN STRL REUS W/TWL LRG LVL3 (GOWN DISPOSABLE) ×2
GOWN STRL REUS W/TWL XL LVL3 (GOWN DISPOSABLE) ×4
KIT BASIN OR (CUSTOM PROCEDURE TRAY) ×3 IMPLANT
KIT ROOM TURNOVER OR (KITS) ×3 IMPLANT
MANIFOLD NEPTUNE II (INSTRUMENTS) ×3 IMPLANT
NEEDLE 1/2 CIR MAYO (NEEDLE) ×3 IMPLANT
NEEDLE 22X1 1/2 (OR ONLY) (NEEDLE) IMPLANT
NS IRRIG 1000ML POUR BTL (IV SOLUTION) ×3 IMPLANT
PACK ORTHO EXTREMITY (CUSTOM PROCEDURE TRAY) ×3 IMPLANT
PAD ARMBOARD 7.5X6 YLW CONV (MISCELLANEOUS) ×6 IMPLANT
PAD CAST 4YDX4 CTTN HI CHSV (CAST SUPPLIES) IMPLANT
PADDING CAST COTTON 4X4 STRL (CAST SUPPLIES)
PADDING CAST COTTON 6X4 STRL (CAST SUPPLIES) IMPLANT
PASSER SUT SWANSON 36MM LOOP (INSTRUMENTS) ×3 IMPLANT
SPONGE LAP 18X18 X RAY DECT (DISPOSABLE) IMPLANT
STAPLER VISISTAT 35W (STAPLE) IMPLANT
STOCKINETTE IMPERVIOUS LG (DRAPES) ×3 IMPLANT
SUCTION FRAZIER HANDLE 10FR (MISCELLANEOUS) ×2
SUCTION TUBE FRAZIER 10FR DISP (MISCELLANEOUS) ×1 IMPLANT
SUT ETHIBOND 2 OS 4 DA (SUTURE) IMPLANT
SUT FIBERWIRE #2 38 T-5 BLUE (SUTURE) ×15
SUT FIBERWIRE #5 38 CONV NDL (SUTURE) ×6
SUT MNCRL AB 3-0 PS2 18 (SUTURE) ×3 IMPLANT
SUT VIC AB 0 CT1 27 (SUTURE) ×2
SUT VIC AB 0 CT1 27XBRD ANBCTR (SUTURE) ×1 IMPLANT
SUT VIC AB 1 CT1 27 (SUTURE) ×4
SUT VIC AB 1 CT1 27XBRD ANBCTR (SUTURE) ×2 IMPLANT
SUT VIC AB 2-0 CT1 27 (SUTURE) ×2
SUT VIC AB 2-0 CT1 TAPERPNT 27 (SUTURE) ×1 IMPLANT
SUTURE FIBERWR #2 38 T-5 BLUE (SUTURE) ×5 IMPLANT
SUTURE FIBERWR #5 38 CONV NDL (SUTURE) ×2 IMPLANT
SYR CONTROL 10ML LL (SYRINGE) IMPLANT
TOWEL OR 17X24 6PK STRL BLUE (TOWEL DISPOSABLE) ×3 IMPLANT
TOWEL OR 17X26 10 PK STRL BLUE (TOWEL DISPOSABLE) ×3 IMPLANT
TRAY FOLEY CATH 16FRSI W/METER (SET/KITS/TRAYS/PACK) IMPLANT
TUBE CONNECTING 12'X1/4 (SUCTIONS) ×1
TUBE CONNECTING 12X1/4 (SUCTIONS) ×2 IMPLANT
WATER STERILE IRR 1000ML POUR (IV SOLUTION) IMPLANT
YANKAUER SUCT BULB TIP NO VENT (SUCTIONS) ×3 IMPLANT

## 2016-11-03 NOTE — Discharge Instructions (Signed)
Please keep the knee immobilizer on at all times.  No bending the leg at all  [facility] on [facility] left leg with the immobilizer on and two crutches for support.  Elevate the leg when possible.  Leave the bandage alone until follow up next week in the office  Call for appointment in one week, [phone]  Do not get the incision wet for one week keep covered - can pull a bag over the leg in the shower.

## 2016-11-03 NOTE — Anesthesia Procedure Notes (Signed)
Anesthesia Regional Block:  Femoral nerve block  Pre-Anesthetic Checklist: ,, timeout performed, Correct Patient, Correct Site, Correct Laterality, Correct Procedure, Correct Position, site marked, Risks and benefits discussed,  Surgical consent,  Pre-op evaluation,  At surgeon's request and post-op pain management  Laterality: Lower and Left  Prep: chloraprep       Needles:  Injection technique: Single-shot  Needle Type: Echogenic Stimulator Needle          Additional Needles:  Procedures: ultrasound guided (picture in chart) Femoral nerve block Narrative:  Start time: 11/03/2016 3:02 PM End time: 11/03/2016 3:10 PM Injection made incrementally with aspirations every 5 mL.  Performed by: Personally  Anesthesiologist: Vernita Tague  Additional Notes: H+P and labs reviewed, risks and benefits discussed with patient, procedure tolerated well without complications

## 2016-11-03 NOTE — H&P (Signed)
Nathan Douglas is an 71 y.o. male.    Chief Complaint: left knee pain  HPI: Pt is a 71 y.o. male complaining of left knee pain for 1 weeks s/p fall. Pain had continually increased since the beginning. Exam suggest left quad tendon tear.  Various options are discussed with the patient. Risks, benefits and expectations were discussed with the patient. Patient understand the risks, benefits and expectations and wishes to proceed with surgery.   PCP:  Enid Skeens., MD  D/C Plans: Home  PMH: Past Medical History:  Diagnosis Date  . GERD (gastroesophageal reflux disease)   . History of kidney stones    able to pass on his own  . Pneumonia   . Sleep apnea    lost weight and no longer uses cpap    PSH: Past Surgical History:  Procedure Laterality Date  . BICEPS TENDON REPAIR    . COLONOSCOPY     has had 3 - was not sedated   . EYE SURGERY Bilateral    cataract surgery with lens implant  . SHOULDER SURGERY    . TONSILLECTOMY     and adenoidectomy    Social History:  reports that he has never smoked. He has never used smokeless tobacco. He reports that he drinks about 1.8 oz of alcohol per week . He reports that he does not use drugs.  Allergies:  No Known Allergies  Medications: Current Facility-Administered Medications  Medication Dose Route Frequency Provider Last Rate Last Dose  . [START ON 11/04/2016] ceFAZolin (ANCEF) IVPB 2g/100 mL premix  2 g Intravenous On Call to [place], PA-C      . chlorhexidine (HIBICLENS) 4 % liquid 4 application  60 mL Topical Once [employer], PA-C      . Chlorhexidine Gluconate Cloth 2 % PADS 6 each  6 each Topical Once Netta Cedars, MD      . fentaNYL (SUBLIMAZE) 100 MCG/2ML injection           . lactated ringers infusion   Intravenous Continuous Montez Hageman, MD 50 mL/hr at 11/03/16 1400 50 mL/hr at 11/03/16 1400  . midazolam (VERSED) 2 MG/2ML injection             Results for orders placed or performed during  the hospital encounter of 11/03/16 (from the past 48 hour(s))  Basic metabolic panel     Status: Abnormal   Collection Time: 11/03/16  1:17 PM  Result Value Ref Range   Sodium 138 135 - 145 mmol/L   Potassium 5.8 (H) 3.5 - 5.1 mmol/L    Comment: HEMOLYSIS AT THIS LEVEL MAY AFFECT RESULT   Chloride 108 101 - 111 mmol/L   CO2 23 22 - 32 mmol/L   Glucose, Bld 95 65 - 99 mg/dL   BUN 21 (H) 6 - 20 mg/dL   Creatinine, Ser 1.05 0.61 - 1.24 mg/dL   Calcium 9.2 8.9 - 10.3 mg/dL   GFR calc non Af Amer >60 >60 mL/min   GFR calc Af Amer >60 >60 mL/min    Comment: (NOTE) The eGFR has been calculated using the CKD EPI equation. This calculation has not been validated in all clinical situations. eGFR's persistently <60 mL/min signify possible Chronic Kidney Disease.    Anion gap 7 5 - 15  CBC     Status: Abnormal   Collection Time: 11/03/16  1:17 PM  Result Value Ref Range   WBC 4.6 4.0 - 10.5 K/uL   RBC 5.51  4.22 - 5.81 MIL/uL   Hemoglobin 16.9 13.0 - 17.0 g/dL   HCT 48.1 39.0 - 52.0 %   MCV 87.3 78.0 - 100.0 fL   MCH 30.7 26.0 - 34.0 pg   MCHC 35.1 30.0 - 36.0 g/dL   RDW 13.0 11.5 - 15.5 %   Platelets 146 (L) 150 - 400 K/uL   No results found.  ROS: Pain with rom of the left lower extremity  Physical Exam:  Alert and oriented 71 y.o. male in no acute distress Cranial nerves 2-12 intact Cervical spine: full rom with no tenderness, nv intact distally Chest: active breath sounds bilaterally, no wheeze rhonchi or rales Heart: regular rate and rhythm, no murmur Abd: non tender non distended with active bowel sounds Hip is stable with rom  Left lower extremity with quad lag and inability to flex left leg nv intact distally Left knee effusion  Assessment/Plan Assessment: left quad tendon rupture  Plan: Patient will undergo a left quad tendon repair by Dr. Veverly Fells at [facility]. Risks benefits and expectations were discussed with the patient. Patient understand risks, benefits  and expectations and wishes to proceed.

## 2016-11-03 NOTE — Transfer of Care (Signed)
Immediate Anesthesia Transfer of Care Note  Patient: Nathan Douglas  Procedure(s) Performed: Procedure(s): Left quadricep tendon repair open (Left)  Patient Location: PACU  Anesthesia Type:General and GA combined with regional for post-op pain  Level of Consciousness: awake and patient cooperative  Airway & Oxygen Therapy: Patient Spontanous Breathing  Post-op Assessment: Report given to RN and Post -op Vital signs reviewed and stable  Post vital signs: Reviewed and stable  Last Vitals:  Vitals:   11/03/16 1337  BP: 138/74  Pulse: 63  Resp: 16  Temp: 36.8 C    Last Pain:  Vitals:   11/03/16 1337  TempSrc: Oral  PainSc: 1       Patients Stated Pain Goal: 3 (11/03/16 1337)  Complications: No apparent anesthesia complications

## 2016-11-03 NOTE — Anesthesia Procedure Notes (Signed)
Procedure Name: LMA Insertion Date/Time: 11/03/2016 3:43 PM Performed by: Rise PatienceBELL, Zanobia Griebel T Pre-anesthesia Checklist: Patient identified, Emergency Drugs available, Suction available and Patient being monitored Patient Re-evaluated:Patient Re-evaluated prior to inductionOxygen Delivery Method: Circle System Utilized Preoxygenation: Pre-oxygenation with 100% oxygen Intubation Type: IV induction Ventilation: Mask ventilation without difficulty LMA: LMA inserted LMA Size: 4.0 Number of attempts: 1 Airway Equipment and Method: Bite block Placement Confirmation: positive ETCO2 and breath sounds checked- equal and bilateral Tube secured with: Tape Dental Injury: Teeth and Oropharynx as per pre-operative assessment

## 2016-11-03 NOTE — Brief Op Note (Signed)
11/03/2016  4:56 PM  PATIENT:  Nathan Douglas  71 y.o. male  PRE-OPERATIVE DIAGNOSIS:  Left quad tendon tear  POST-OPERATIVE DIAGNOSIS:  Left quad tendon tear  PROCEDURE:  Procedure(s): Left quadricep tendon repair open (Left) sutures through drill holes  SURGEON:  Surgeon(s) and Role:    * Beverely LowSteve Abshir Paolini, MD - Primary  PHYSICIAN ASSISTANT:   ASSISTANTS: Thea Gisthomas B Dixon, PA-C   ANESTHESIA:   regional and general  EBL:  Total I/O In: 1000 [I.V.:1000] Out: 50 [Blood:50]  BLOOD ADMINISTERED:none  DRAINS: none   LOCAL MEDICATIONS USED:  NONE  SPECIMEN:  No Specimen  DISPOSITION OF SPECIMEN:  N/A  COUNTS:  YES  TOURNIQUET:   Total Tourniquet Time Documented: Thigh (Left) - 56 minutes Total: Thigh (Left) - 56 minutes   DICTATION: .Other Dictation: Dictation Number [id number]  PLAN OF CARE: Discharge to home after PACU  PATIENT DISPOSITION:  PACU - hemodynamically stable.   Delay start of Pharmacological VTE agent (>24hrs) due to surgical blood loss or risk of bleeding: not applicable

## 2016-11-03 NOTE — Anesthesia Preprocedure Evaluation (Addendum)
Anesthesia Evaluation  Patient identified by MRN, date of birth, ID band Patient awake    Reviewed: Allergy & Precautions, NPO status , Patient's Chart, lab work & pertinent test results  History of Anesthesia Complications Negative for: history of anesthetic complications  Airway Mallampati: II  TM Distance: >3 FB Neck ROM: Full    Dental  (+) Teeth Intact, Partial Upper, Dental Advisory Given   Pulmonary    breath sounds clear to auscultation       Cardiovascular negative cardio ROS   Rhythm:Regular Rate:Normal     Neuro/Psych negative neurological ROS     GI/Hepatic Neg liver ROS, GERD  ,  Endo/Other  negative endocrine ROSHyperthyroidism   Renal/GU      Musculoskeletal   Abdominal   Peds  Hematology negative hematology ROS (+)   Anesthesia Other Findings   Reproductive/Obstetrics                           Anesthesia Physical Anesthesia Plan  ASA: II  Anesthesia Plan: General   Post-op Pain Management:  Regional for Post-op pain   Induction: Intravenous  Airway Management Planned: LMA  Additional Equipment:   Intra-op Plan:   Post-operative Plan: Extubation in OR  Informed Consent: I have reviewed the patients History and Physical, chart, labs and discussed the procedure including the risks, benefits and alternatives for the proposed anesthesia with the patient or authorized representative who has indicated his/her understanding and acceptance.   Dental advisory given  Plan Discussed with: CRNA  Anesthesia Plan Comments:         Anesthesia Quick Evaluation

## 2016-11-03 NOTE — H&P (Signed)
Nathan Douglas is an 71 y.o. male.   Chief Complaint: left quad tendon tear HPI: 71 yo male who suffered a fall this past week in which his leg was forced under him and he felt a tear and heard a pop.  He experienced immediate severe pain in the left thigh.  He was unable to ambulate after the injury.  Patient seen in the ED and diagnosed with a quad tendon tear and referred to [facility] for definitive management.  Past Medical History:  Diagnosis Date  . GERD (gastroesophageal reflux disease)   . History of kidney stones    able to pass on his own  . Pneumonia   . Sleep apnea    lost weight and no longer uses cpap    Past Surgical History:  Procedure Laterality Date  . BICEPS TENDON REPAIR    . COLONOSCOPY     has had 3 - was not sedated   . EYE SURGERY Bilateral    cataract surgery with lens implant  . SHOULDER SURGERY    . TONSILLECTOMY     and adenoidectomy    Family History  Problem Relation Age of Onset  . Hypertension Mother   . Heart attack Mother   . Hypertension Father   . CAD Father   . Valvular heart disease Father    Social History:  reports that he has never smoked. He has never used smokeless tobacco. He reports that he drinks about 1.8 oz of alcohol per week . He reports that he does not use drugs.  Allergies: No Known Allergies  Medications Prior to Admission  Medication Sig Dispense Refill  . cholecalciferol (VITAMIN D) 1000 units tablet Take 1,000 Units by mouth daily.    . fluticasone (FLONASE) 50 MCG/ACT nasal spray Place 2 sprays into both nostrils every morning.    Marland Kitchen glucosamine-chondroitin 500-400 MG tablet Take 1 tablet by mouth daily.    . Inositol Niacinate (NIACIN FLUSH FREE) 500 MG CAPS Take 1 capsule by mouth daily.    Nathan Douglas 500 MG CAPS Take 1 capsule by mouth daily.    . Multiple Vitamins-Minerals (MULTIVITAMINS THER. W/MINERALS) TABS Take 1 tablet by mouth daily.    . Omega 3 1200 MG CAPS Take 1 capsule by mouth daily.    Marland Kitchen  omeprazole (PRILOSEC) 20 MG capsule Take 20 mg by mouth daily.    . potassium chloride (MICRO-K) 10 MEQ CR capsule Take 10 mEq by mouth daily.    . saw palmetto 160 MG capsule Take 160 mg by mouth daily.    . sildenafil (VIAGRA) 100 MG tablet Take 50 mg by mouth daily as needed. For erectile dysfunction    . terbinafine (LAMISIL) 250 MG tablet Take 250 mg by mouth every other day.     . Testosterone Cypionate 200 MG/ML KIT Inject 67 mg into the muscle once a week.    . vitamin C (ASCORBIC ACID) 500 MG tablet Take 500 mg by mouth daily.      Results for orders placed or performed during the hospital encounter of 11/03/16 (from the past 48 hour(s))  Basic metabolic panel     Status: Abnormal   Collection Time: 11/03/16  1:17 PM  Result Value Ref Range   Sodium 138 135 - 145 mmol/L   Potassium 5.8 (H) 3.5 - 5.1 mmol/L    Comment: HEMOLYSIS AT THIS LEVEL MAY AFFECT RESULT   Chloride 108 101 - 111 mmol/L   CO2 23 22 -  32 mmol/L   Glucose, Bld 95 65 - 99 mg/dL   BUN 21 (H) 6 - 20 mg/dL   Creatinine, Ser 1.05 0.61 - 1.24 mg/dL   Calcium 9.2 8.9 - 10.3 mg/dL   GFR calc non Af Amer >60 >60 mL/min   GFR calc Af Amer >60 >60 mL/min    Comment: (NOTE) The eGFR has been calculated using the CKD EPI equation. This calculation has not been validated in all clinical situations. eGFR's persistently <60 mL/min signify possible Chronic Kidney Disease.    Anion gap 7 5 - 15  CBC     Status: Abnormal   Collection Time: 11/03/16  1:17 PM  Result Value Ref Range   WBC 4.6 4.0 - 10.5 K/uL   RBC 5.51 4.22 - 5.81 MIL/uL   Hemoglobin 16.9 13.0 - 17.0 g/dL   HCT 48.1 39.0 - 52.0 %   MCV 87.3 78.0 - 100.0 fL   MCH 30.7 26.0 - 34.0 pg   MCHC 35.1 30.0 - 36.0 g/dL   RDW 13.0 11.5 - 15.5 %   Platelets 146 (L) 150 - 400 K/uL   No results found.  ROS no hip or ankle pain  Blood pressure 138/74, pulse 63, temperature 98.2 F (36.8 C), temperature source Oral, resp. rate 16, weight 78 kg (172 lb), SpO2  98 %. Physical Exam AAO, NAD, chest normal excursion, RRR, Right LE with normal AROM and strength, left LE with extensor lag with pain with SLR, defect noted at the top of the patella consistent with a quad tendon rupture   Assessment/Plan Complete or near complete Quad tendon tear after fall.  Plan primary repair. DVT prophylaxis  Nathan Douglas,STEVEN R, MD 11/03/2016, 3:08 PM

## 2016-11-03 NOTE — Anesthesia Postprocedure Evaluation (Addendum)
Anesthesia Post Note  Patient: Nathan Douglas  Procedure(s) Performed: Procedure(s) (LRB): Left quadricep tendon repair open (Left)  Patient location during evaluation: PACU Anesthesia Type: Regional Level of consciousness: awake and alert Pain management: pain level controlled Vital Signs Assessment: post-procedure vital signs reviewed and stable Respiratory status: spontaneous breathing, nonlabored ventilation, respiratory function stable and patient connected to nasal cannula oxygen Cardiovascular status: blood pressure returned to baseline and stable Postop Assessment: no signs of nausea or vomiting Anesthetic complications: no       Last Vitals:  Vitals:   11/03/16 1714 11/03/16 1715  BP: 137/85 137/85  Pulse: 63 68  Resp: 14 14  Temp:  36.1 C    Last Pain:  Vitals:   11/03/16 1337  TempSrc: Oral  PainSc: 1     LLE Motor Response: Purposeful movement (11/03/16 1714) LLE Sensation: Full sensation (11/03/16 1714)          Khylei Wilms

## 2016-11-04 ENCOUNTER — Encounter (HOSPITAL_COMMUNITY): Payer: Self-pay | Admitting: Orthopedic Surgery

## 2016-11-04 NOTE — Op Note (Signed)
NAME:  Nathan Douglas, Nathan Douglas NO.:  [id number]  MEDICAL RECORD NO.:  [id number]  LOCATION:                               FACILITY:  [facility]  PHYSICIAN:  Almedia Balls. Ranell Patrick, M.D. DATE OF BIRTH:  05-17-1946  DATE OF PROCEDURE:  11/03/2016 DATE OF DISCHARGE:                              OPERATIVE REPORT   PREOPERATIVE DIAGNOSIS:  Left quadriceps tendon tear.  POSTOPERATIVE DIAGNOSIS:  Left quadriceps tendon tear.  PROCEDURE PERFORMED:  Left open quadriceps tendon repair with sutures through drill holes.  ATTENDING SURGEON:  Almedia Balls. Ranell Patrick, MD.  ASSISTANT:  Thea Gist, PA-C, who scrubbed the entire procedure and necessary for satisfactory completion of surgery.  ANESTHESIA:  General anesthesia was used plus femoral nerve block.  ESTIMATED BLOOD LOSS:  Minimal.  FLUID REPLACEMENT:  1500 mL crystalloid.  INSTRUMENT COUNTS:  Correct.  COMPLICATIONS:  There were no complications.  ANTIBIOTICS:  Perioperative antibiotics were given.  TOURNIQUET TIME:  56 minutes at 300 mmHg.  INDICATIONS:  The patient is a 71 year old male who had a hyperflexion injury to his left leg.  The patient suffered a quadriceps tendon tear both clinically and radiographically as verified by diagnostic ultrasound.  The patient presents for operative quadriceps tendon repair to restore extensor mechanism integrity.  Risks and benefits of surgery discussed, informed consent obtained.  DESCRIPTION OF PROCEDURE:  After an adequate level of anesthesia achieved, the patient positioned in the supine position.  The left leg correctly identified.  Nonsterile tourniquet placed to proximal thigh. Left leg was sterilely prepped and draped in usual manner.  Time-out was called.  The leg was elevated and exsanguinated using Esmarch bandage. Tourniquet elevated to 300 mmHg.  A longitudinal midline incision was created with a #10 blade scalpel.  Dissection down through subcutaneous tissues using the  scalpel.  We were able to dissect around the quadriceps tendon.  The tendon was completely torn away from the superior pole of the patella.  There was a strip of tendon on the deep side of the patella that we were able to utilize that was still attached with this native attachment.  We are going to basically put that on the backside of the advanced quadriceps tendon.  We did notice a little bit of bone at the end of the quad tendon, which we removed as we freshened up the end of the tendon with a #10 blade scalpel.  We took #5 FiberWire suture in a locked baseball stitch fashion up into the quadriceps and back down for a total of 4 suture strands coming out the end of the tendon.  We then drilled 3 holes from proximal to distal through the patella exiting at the distal pole of the patella.  We then used a Swanson suture passer to pass those sutures, one suture limb through the lateral holes and then 2 through the central hole.  We then tunneled the suture under the patellar tendon inferiorly, and then we repaired the retinaculum with interrupted #2 FiberWire suture in the gutters in retinacular repair.  There were about 4-5 of those sutures out on each side.  We also took sutures through that deep quadriceps tendon that was  still attached to the native patella, and we had freshened up the patellar tendon insertion site with a rongeur and curette to make sure that we could accept the tendon and heal well.  We had a layered closure there.  We tied our longitudinal sutures through the bone first, placed the knee in hyperextension.  When we did that, we were able to get good compression of the tendon into the bone and then we tied our lateral and medial sutures next repairing the retinaculum.  We had anatomic repair. We were able to range the knee to 45 degrees, no gapping at all.  We irrigated thoroughly and then repaired the subcu with 2-0 Vicryl and 4-0 Monocryl for the skin.  Steri-Strips,  sterile bandage, and a knee immobilizer applied.  The patient tolerated the surgery well.     Almedia BallsSteven R. Ranell PatrickNorris, M.D.   ______________________________ Almedia BallsSteven R. Ranell PatrickNorris, M.D.    SRN/MEDQ  D:  11/03/2016  T:  11/04/2016  Job:  [id number]

## 2016-11-04 NOTE — Op Note (Deleted)
  The note originally documented on this encounter has been moved the the encounter in which it belongs.  

## 2017-03-05 NOTE — Addendum Note (Signed)
Addendum  created 03/05/17 1058 by Jannah Guardiola, MD   Sign clinical note    

## 2018-10-21 IMAGING — DX DG KNEE COMPLETE 4+V*L*
4 series · 4 of 4 positions shown · non-contrast
Comparison: None.

CLINICAL DATA: Left knee pain with difficulty bearing weight after
falling down stairs. Quadriceps tendon rupture.

EXAM:
LEFT KNEE - COMPLETE 4+ VIEW

[knee ap]
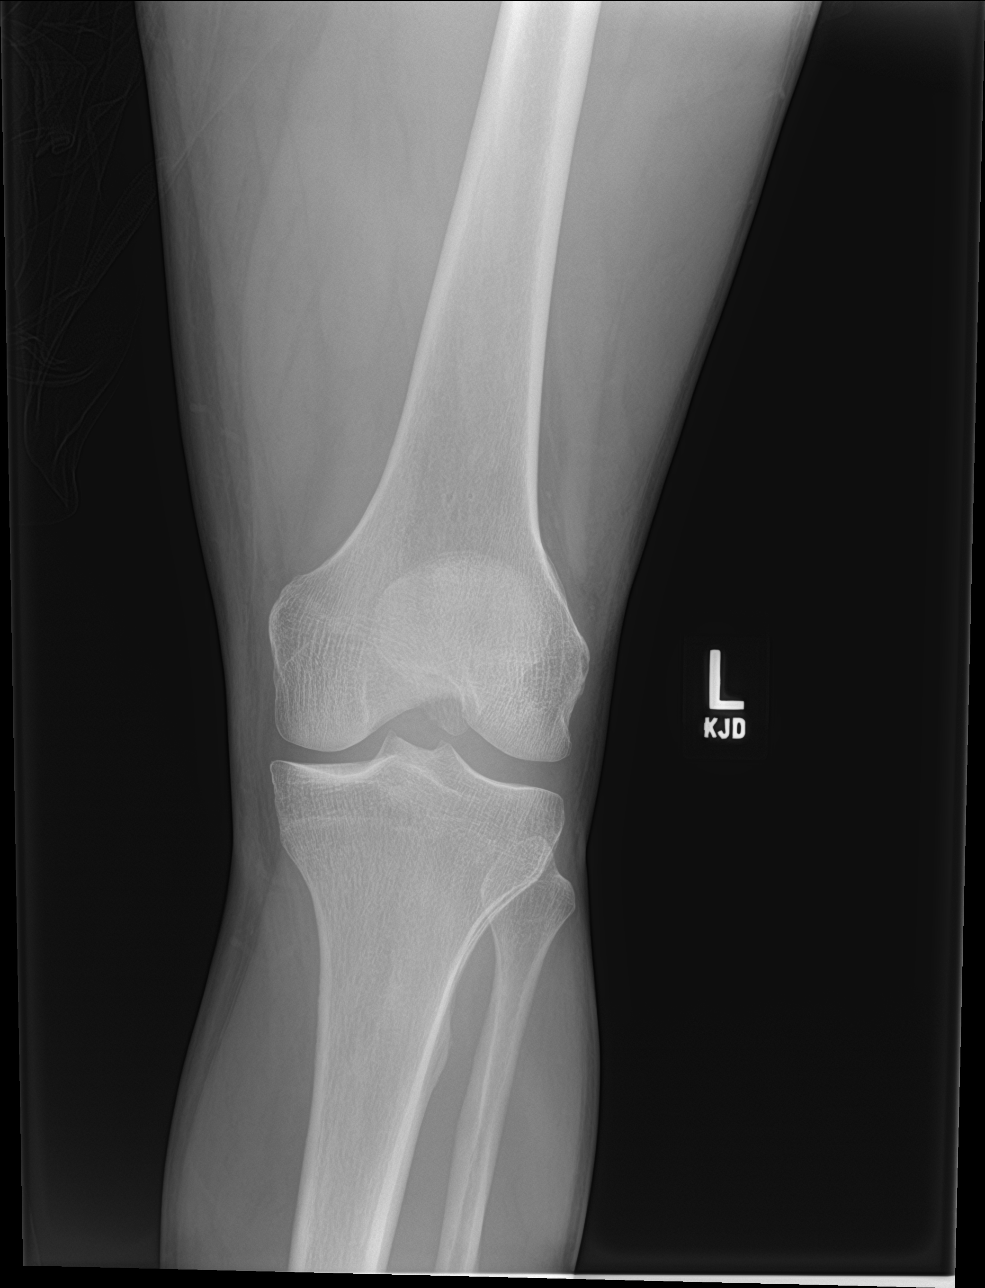

[knee lat]
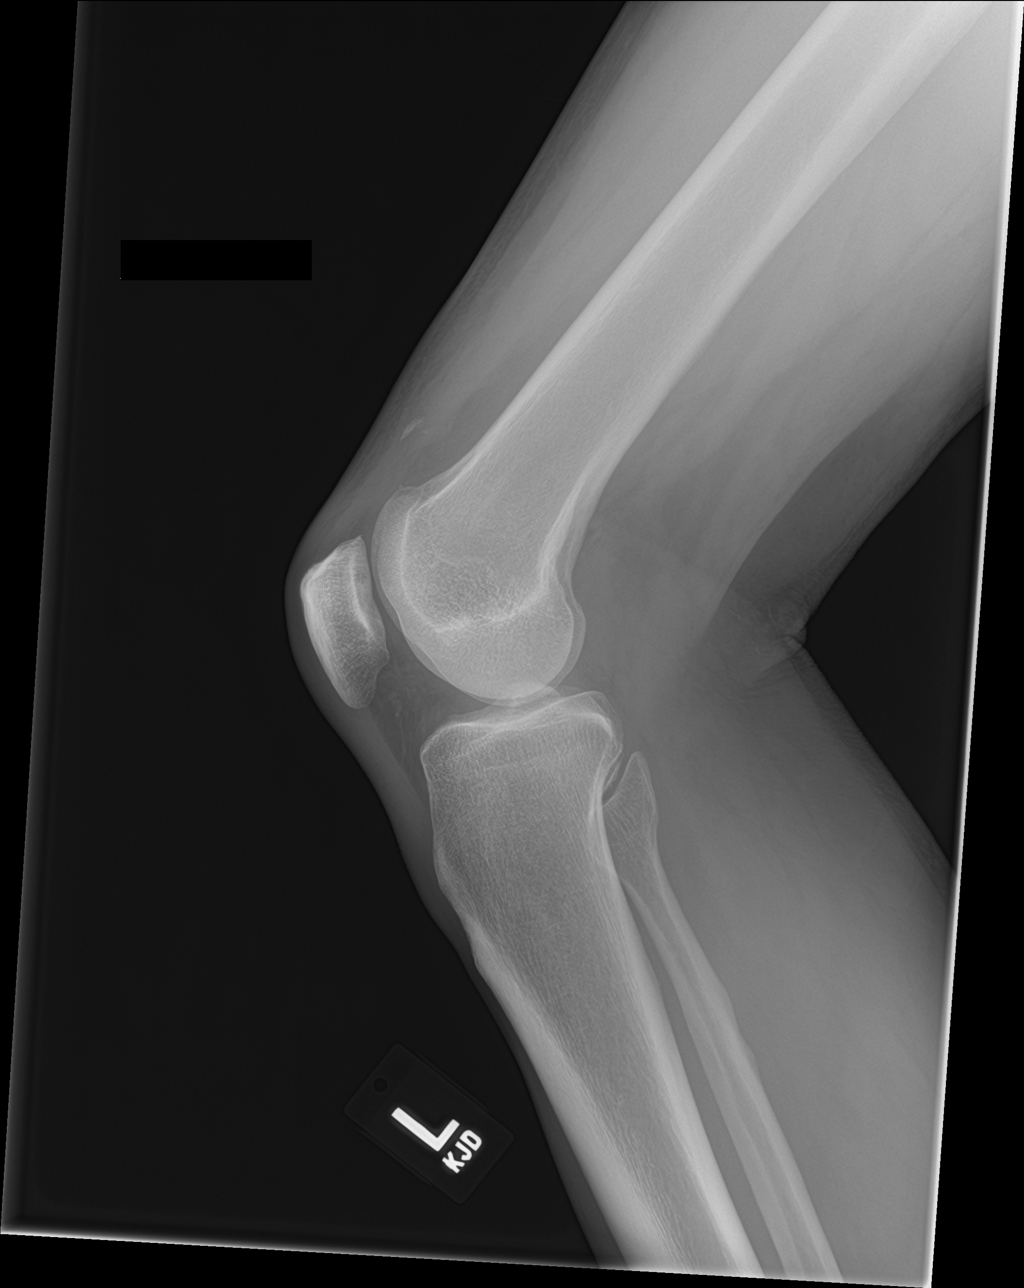

[knee obl (1 of 2)]
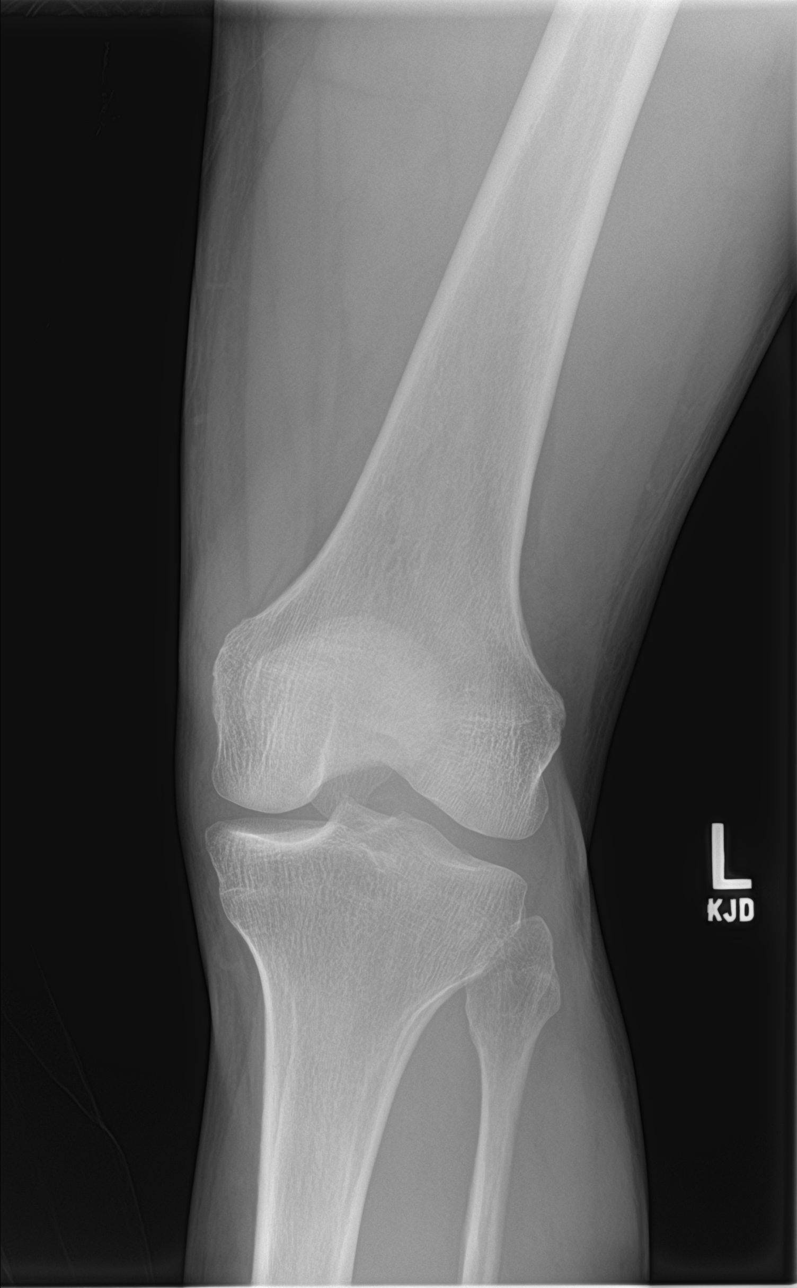

[knee obl (2 of 2)]
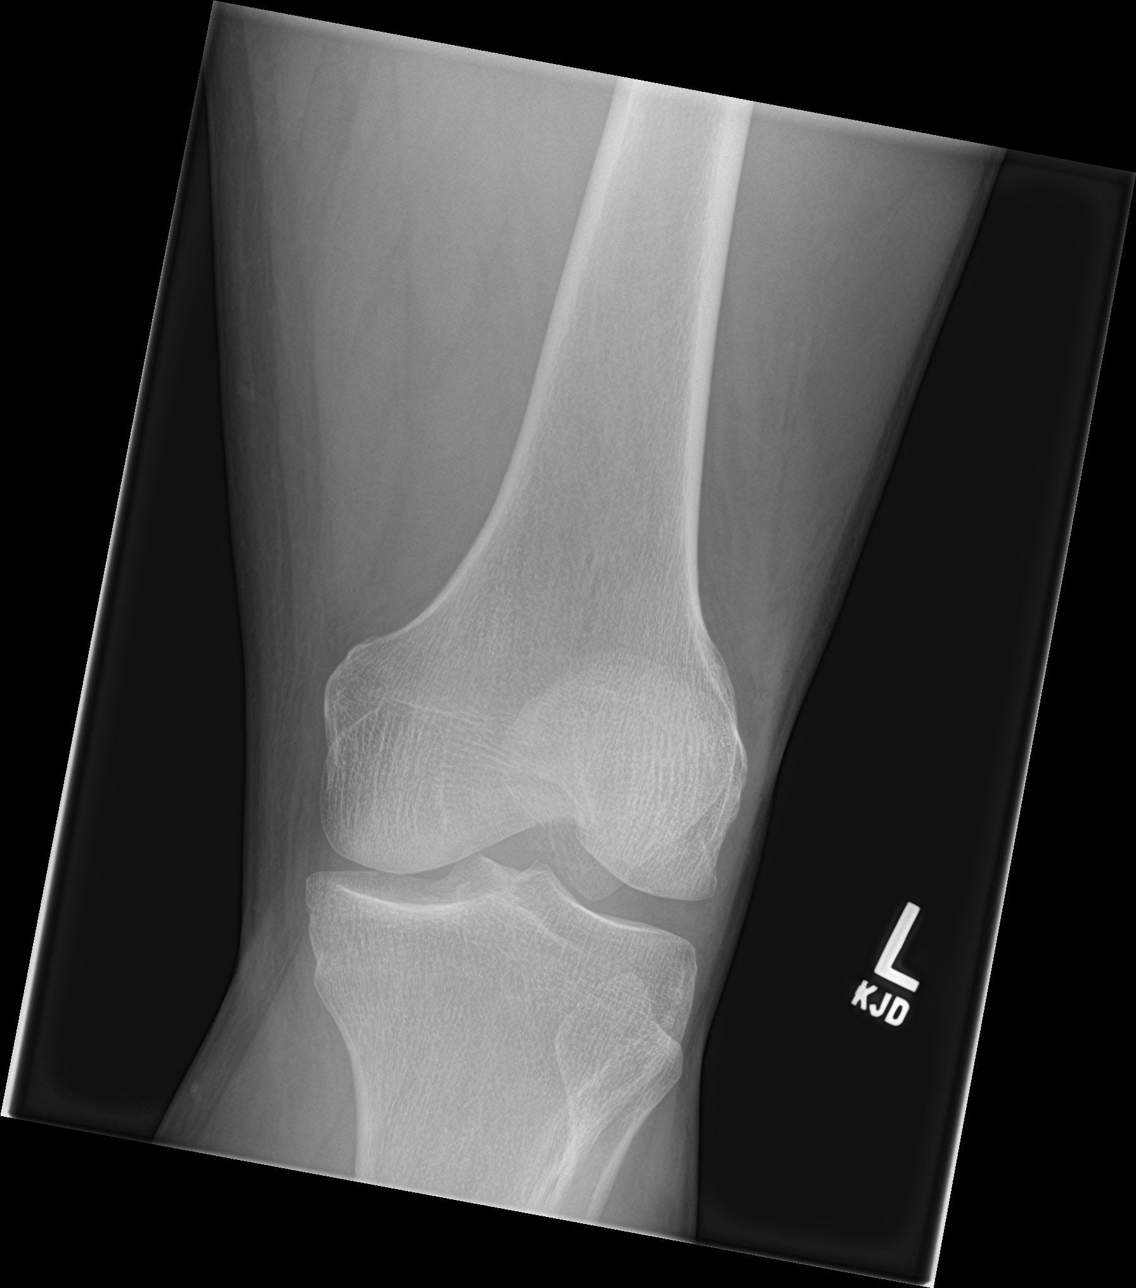

[4 of 4 positions shown; findings below may reference images not displayed]

FINDINGS: There is 10 x 2 mm avulsed bony fragment seen on the lateral view
along the anterior aspect of the distal left thigh likely
representing a quadriceps tendon avulsion. This fragment is 3 cm
above the upper pole of the patella. Soft tissue thickening adjacent
to the avulsed fragment likely represents a retracted tendon. No
significant joint effusion. The femorotibial and patellofemoral
compartments maintained.
IMPRESSION: Findings would be in keeping with an avulsion of the quadriceps
tendon insertion with small 10 x 2mm bony fragment seen
approximately 3 cm from the upper pole of the patella.
